# Patient Record
Sex: Male | Born: 1965 | Race: White | Hispanic: No | Marital: Single
Health system: Southern US, Community
[De-identification: ages and names within clinical notes are randomized; demographics above are authoritative.]

---

## 2017-06-16 ENCOUNTER — Emergency Department (HOSPITAL_COMMUNITY): Payer: 59

## 2017-06-16 ENCOUNTER — Encounter (HOSPITAL_COMMUNITY): Payer: Self-pay

## 2017-06-16 ENCOUNTER — Emergency Department (HOSPITAL_COMMUNITY)
Admission: EM | Admit: 2017-06-16 | Discharge: 2017-06-17 | Disposition: A | Discharge status: Home / Self Care | Payer: 59 | Attending: Emergency Medicine | Admitting: Emergency Medicine

## 2017-06-16 DIAGNOSIS — Y908 Blood alcohol level of 240 mg/100 ml or more: Secondary | ICD-10-CM | POA: Diagnosis not present

## 2017-06-16 DIAGNOSIS — F10220 Alcohol dependence with intoxication, uncomplicated: Secondary | ICD-10-CM | POA: Diagnosis not present

## 2017-06-16 DIAGNOSIS — R569 Unspecified convulsions: Secondary | ICD-10-CM | POA: Insufficient documentation

## 2017-06-16 DIAGNOSIS — F1910 Other psychoactive substance abuse, uncomplicated: Secondary | ICD-10-CM | POA: Diagnosis not present

## 2017-06-16 LAB — RAPID URINE DRUG SCREEN, HOSP PERFORMED
AMPHETAMINES: NOT DETECTED
BARBITURATES: NOT DETECTED
Benzodiazepines: NOT DETECTED
Cocaine: POSITIVE — AB
Opiates: NOT DETECTED
Tetrahydrocannabinol: NOT DETECTED

## 2017-06-16 LAB — ETHANOL: ALCOHOL ETHYL (B): 368 mg/dL — AB (ref <5)

## 2017-06-16 LAB — CBC WITH DIFFERENTIAL/PLATELET
BASOS ABS: 0 10*3/uL (ref 0.0–0.1)
Basophils Relative: 1 %
Eosinophils Absolute: 0 10*3/uL (ref 0.0–0.7)
Eosinophils Relative: 0 %
HEMATOCRIT: 51.4 % (ref 39.0–52.0)
HEMOGLOBIN: 18.9 g/dL — AB (ref 13.0–17.0)
LYMPHS PCT: 29 %
Lymphs Abs: 2.4 10*3/uL (ref 0.7–4.0)
MCH: 33.1 pg (ref 26.0–34.0)
MCHC: 36.8 g/dL — ABNORMAL HIGH (ref 30.0–36.0)
MCV: 90 fL (ref 78.0–100.0)
MONO ABS: 0.5 10*3/uL (ref 0.1–1.0)
Monocytes Relative: 6 %
NEUTROS ABS: 5.2 10*3/uL (ref 1.7–7.7)
Neutrophils Relative %: 64 %
Platelets: 282 10*3/uL (ref 150–400)
RBC: 5.71 MIL/uL (ref 4.22–5.81)
RDW: 13.7 % (ref 11.5–15.5)
WBC: 8.1 10*3/uL (ref 4.0–10.5)

## 2017-06-16 LAB — COMPREHENSIVE METABOLIC PANEL
ALT: 34 U/L (ref 17–63)
AST: 39 U/L (ref 15–41)
Albumin: 4.7 g/dL (ref 3.5–5.0)
Alkaline Phosphatase: 85 U/L (ref 38–126)
Anion gap: 18 — ABNORMAL HIGH (ref 5–15)
BILIRUBIN TOTAL: 0.7 mg/dL (ref 0.3–1.2)
BUN: 8 mg/dL (ref 6–20)
CO2: 26 mmol/L (ref 22–32)
CREATININE: 0.99 mg/dL (ref 0.61–1.24)
Calcium: 9.6 mg/dL (ref 8.9–10.3)
Chloride: 98 mmol/L — ABNORMAL LOW (ref 101–111)
GFR calc Af Amer: >60 mL/min (ref >60)
Glucose, Bld: 122 mg/dL — ABNORMAL HIGH (ref 65–99)
Potassium: 3.6 mmol/L (ref 3.5–5.1)
Sodium: 142 mmol/L (ref 135–145)
TOTAL PROTEIN: 8.5 g/dL — AB (ref 6.5–8.1)

## 2017-06-16 MED ORDER — SODIUM CHLORIDE 0.9 % IV SOLN
1500.0000 mg | Freq: Once | INTRAVENOUS | Status: AC
  Administered 2017-06-16: 1500 mg via INTRAVENOUS
  Filled 2017-06-16: qty 15

## 2017-06-16 MED ORDER — ONDANSETRON HCL 4 MG/2ML IJ SOLN
4.0000 mg | Freq: Once | INTRAMUSCULAR | Status: AC
  Administered 2017-06-16: 4 mg via INTRAVENOUS
  Filled 2017-06-16: qty 2

## 2017-06-16 MED ORDER — SODIUM CHLORIDE 0.9 % IV BOLUS (SEPSIS)
1000.0000 mL | Freq: Once | INTRAVENOUS | Status: AC
  Administered 2017-06-16: 1000 mL via INTRAVENOUS

## 2017-06-16 NOTE — ED Provider Notes (Signed)
[facility]-EMERGENCY DEPT Provider Note   CSN: [id number] Arrival date & time: 06/16/17  1551     History   Chief Complaint Chief Complaint  Patient presents with  . Seizures    HPI Derek Ramsey is a 51 y.o. male.  HPI   51 year old male with a history of alcohol withdrawal seizures presents with concern for seizure. Patient reports that he has been drinking a lot over the last 3 days. Reports that he had been sober, however started drinking again about 3 days ago and has been drinking "A LOT, I mean A LOT" he is not sure when his last drink was today, but reports may be it was 5 AM, may be 11 AM, and had gone to try to check himself into rehabilitation. While he was in the waiting room at rehabilitation he had a grand mal seizure while sitting in a chair. No history of trauma. He was given Valium, and became minimally responsive with EMS, however on arrival to the emergency department he is awake, answering questions however appears intoxicated. Patient reports he did think he was having anxiety and tremors prior to this. He has a history of alcohol withdrawal seizures, but no other history of seizure disorder. Denies any other recent illness  History reviewed. No pertinent past medical history.  There are no active problems to display for this patient.   History reviewed. No pertinent surgical history.     Home Medications    Prior to Admission medications   Not on File    Family History No family history on file.  Social History Social History  Substance Use Topics  . Smoking status: Unknown If Ever Smoked  . Smokeless tobacco: Not on file  . Alcohol use Yes     Comment: heavy binge     Allergies   Patient has no known allergies.   Review of Systems Review of Systems  Constitutional: Negative for fever.  HENT: Negative for sore throat.   Eyes: Negative for visual disturbance.  Respiratory: Negative for shortness of breath.   Cardiovascular: Negative for  chest pain.  Gastrointestinal: Negative for abdominal pain.  Genitourinary: Negative for difficulty urinating.  Musculoskeletal: Negative for back pain and neck stiffness.  Skin: Negative for rash.  Neurological: Positive for seizures. Negative for syncope and headaches.     Physical Exam Updated Vital Signs BP (!) 152/105   Pulse 93   Temp (!) 97.5 F (36.4 C) (Axillary)   Resp 10   SpO2 95%   Physical Exam  Constitutional: He is oriented to person, place, and time. He appears well-developed and well-nourished. No distress.  Appears intoxicated  HENT:  Head: Normocephalic and atraumatic.  Eyes: Conjunctivae and EOM are normal.  Neck: Normal range of motion.  Cardiovascular: Normal rate, regular rhythm, normal heart sounds and intact distal pulses.  Exam reveals no gallop and no friction rub.   No murmur heard. Pulmonary/Chest: Effort normal and breath sounds normal. No respiratory distress. He has no wheezes. He has no rales.  Abdominal: Soft. He exhibits no distension. There is no tenderness. There is no guarding.  Musculoskeletal: He exhibits no edema.  Neurological: He is alert and oriented to person, place, and time. He displays no tremor.  No tremors  Skin: Skin is warm and dry. He is not diaphoretic.  Nursing note and vitals reviewed.    ED Treatments / Results  Labs (all labs ordered are listed, but only abnormal results are displayed) Labs Reviewed  CBC WITH DIFFERENTIAL/PLATELET -  Abnormal; Notable for the following:       Result Value   Hemoglobin 18.9 (*)    MCHC 36.8 (*)    All other components within normal limits  COMPREHENSIVE METABOLIC PANEL - Abnormal; Notable for the following:    Chloride 98 (*)    Glucose, Bld 122 (*)    Total Protein 8.5 (*)    Anion gap 18 (*)    All other components within normal limits  ETHANOL - Abnormal; Notable for the following:    Alcohol, Ethyl (B) 368 (*)    All other components within normal limits  RAPID URINE  DRUG SCREEN, HOSP PERFORMED - Abnormal; Notable for the following:    Cocaine POSITIVE (*)    All other components within normal limits    EKG  EKG Interpretation  Date/Time:  Tuesday June 16 2017 16:07:51 EDT Ventricular Rate:  106 PR Interval:    QRS Duration: 89 QT Interval:  333 QTC Calculation: 443 R Axis:   55 Text Interpretation:  Sinus tachycardia No prior ECG Confirmed by Alvira Monday ([id number]) on 06/16/2017 6:26:43 PM       Radiology Ct Head Wo Contrast  Result Date: 06/16/2017 CLINICAL DATA:  Recent seizure activity EXAM: CT HEAD WITHOUT CONTRAST TECHNIQUE: Contiguous axial images were obtained from the base of the skull through the vertex without intravenous contrast. COMPARISON:  None. FINDINGS: Brain: Mild atrophic changes are noted. No findings to suggest acute hemorrhage, acute infarction or space-occupying mass lesion are noted. Vascular: No hyperdense vessel or unexpected calcification. Skull: Normal. Negative for fracture or focal lesion. Sinuses/Orbits: Opacification of the left maxillary antrum is noted of uncertain chronicity. It has a chronic appearance however. 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 No acute intracranial abnormality is noted. Electronically Signed   By: Alcide Clever M.D.   On: 06/16/2017 17:04    Procedures Procedures (including critical care time)  Medications Ordered in ED Medications  sodium chloride 0.9 % bolus 1,000 mL (0 mLs Intravenous Stopped 06/16/17 1805)  ondansetron (ZOFRAN) injection 4 mg (4 mg Intravenous Given 06/16/17 1627)  levETIRAcetam (KEPPRA) 1,500 mg in sodium chloride 0.9 % 100 mL IVPB (0 mg Intravenous Stopped 06/16/17 1805)     Initial Impression / Assessment and Plan / ED Course  I have reviewed the triage vital signs and the nursing notes.  Pertinent labs & imaging results that were available during my care of the patient were reviewed by me and  considered in my medical decision making (see chart for details).    51 year old male with history of alcohol withdrawal seizures presents with concern for seizure. Patient had checked into a rehabilitation facility and had a grand mal seizure all waiting to be seen, with reporting last drink several hours prior to his presentation there although history is limited.  The patient is mildly hypertensive and mildly tachycardic on exam, he has no tremors, and appears quite intoxicated.  His alcohol level is 368. Given significant elevation and alcohol level, no tremors, patient appearing intoxicated on exam more than appearing woman in withdrawal, I doubt this represents a withdrawal seizure. Patient's UDS is positive for cocaine, and feel his seizures likely secondary to cocaine use. This may also contribute to his mild tachycardia and hypertension.  Head CT shows no acute abnormalities. Other labs showed no significant electrolyte abnormalities.  Patient was given Keppra the emergency department.  However, with UDS positive for cocaine, feel this most likely  etiology seizure, and patient does not have any history of prior seizures that were not related to alcohol withdrawal or substance abuse, so do not feel continued antiepileptics are indicated. We will refer him to neurology, however for further testing, consideration outpatient EEG.  Discussed results with patient.  Patient observed until clinically sober in the ED, able to ambulate, drinking fluids.  [facility].    Final Clinical Impressions(s) / ED Diagnoses   Final diagnoses:  Seizure (HCC)  Substance abuse  Alcohol dependence with uncomplicated intoxication (HCC)    New Prescriptions New Prescriptions   No medications on file     Alvira MondaySchlossman, Ura Hausen, MD 06/17/17 [phone]

## 2017-06-16 NOTE — ED Notes (Signed)
Eric from [facility] called for update on patient. Stated when pt is alert and able to ambulate and discharged to call facilty and someone should be able to pick him up.

## 2017-06-16 NOTE — ED Notes (Signed)
Patient transported to CT 

## 2017-06-16 NOTE — ED Triage Notes (Signed)
Per EMS, pt 4 years sober relapsed on alcohol, drank vodka, blew .29 BAC was at a rehab facility, had witnessed grand mal seizure x 2 minutes. Given 10 mg Valium IM at 1500, heavily sedated, become unconscious and difficult to arouse with deep painful stimuli.

## 2017-06-16 NOTE — ED Notes (Signed)
Patient continues resting with eyes closed, snoring at times, VS wnl.

## 2017-06-16 NOTE — ED Notes (Signed)
Bed: RESA Expected date:  Expected time:  Means of arrival:  Comments: 51 yo m seizure, unresponsive

## 2017-06-16 NOTE — ED Notes (Signed)
Escorted patient to restroom via walking Patient very unsteady on feet and stumbles back and forth.

## 2017-06-17 NOTE — ED Notes (Signed)
Patient has drank 10 cups of juice with no emesis. [facility]waiting [facility] to come and transport patient back to facility

## 2017-06-17 NOTE — ED Notes (Signed)
Report to Marylu LundJanet at [facility] and labs faxed over for viewing

## 2017-06-17 NOTE — ED Notes (Signed)
Patient ambulatory to nurse's desk to ask for more juice-Dr. Dalene SeltzerSchlossman made aware

## 2018-09-30 ENCOUNTER — Emergency Department (HOSPITAL_COMMUNITY): Payer: BLUE CROSS/BLUE SHIELD

## 2018-09-30 ENCOUNTER — Emergency Department (HOSPITAL_COMMUNITY)
Admission: EM | Admit: 2018-09-30 | Discharge: 2018-09-30 | Disposition: A | Discharge status: Home / Self Care | Payer: BLUE CROSS/BLUE SHIELD | Attending: Emergency Medicine | Admitting: Emergency Medicine

## 2018-09-30 ENCOUNTER — Encounter (HOSPITAL_COMMUNITY): Payer: Self-pay | Admitting: Emergency Medicine

## 2018-09-30 DIAGNOSIS — F10921 Alcohol use, unspecified with intoxication delirium: Secondary | ICD-10-CM

## 2018-09-30 DIAGNOSIS — F1410 Cocaine abuse, uncomplicated: Secondary | ICD-10-CM | POA: Diagnosis not present

## 2018-09-30 DIAGNOSIS — G40909 Epilepsy, unspecified, not intractable, without status epilepticus: Secondary | ICD-10-CM | POA: Insufficient documentation

## 2018-09-30 DIAGNOSIS — F10121 Alcohol abuse with intoxication delirium: Secondary | ICD-10-CM | POA: Insufficient documentation

## 2018-09-30 DIAGNOSIS — F10920 Alcohol use, unspecified with intoxication, uncomplicated: Secondary | ICD-10-CM

## 2018-09-30 DIAGNOSIS — R569 Unspecified convulsions: Secondary | ICD-10-CM

## 2018-09-30 LAB — CBC WITH DIFFERENTIAL/PLATELET
ABS IMMATURE GRANULOCYTES: 0.01 10*3/uL (ref 0.00–0.07)
BASOS PCT: 1 %
Basophils Absolute: 0.1 10*3/uL (ref 0.0–0.1)
Eosinophils Absolute: 0.1 10*3/uL (ref 0.0–0.5)
Eosinophils Relative: 1 %
HCT: 53.8 % — ABNORMAL HIGH (ref 39.0–52.0)
HEMOGLOBIN: 17.2 g/dL — AB (ref 13.0–17.0)
Immature Granulocytes: 0 %
LYMPHS PCT: 24 %
Lymphs Abs: 1.6 10*3/uL (ref 0.7–4.0)
MCH: 30.4 pg (ref 26.0–34.0)
MCHC: 32 g/dL (ref 30.0–36.0)
MCV: 95.1 fL (ref 80.0–100.0)
Monocytes Absolute: 0.5 10*3/uL (ref 0.1–1.0)
Monocytes Relative: 7 %
NEUTROS ABS: 4.2 10*3/uL (ref 1.7–7.7)
NEUTROS PCT: 67 %
PLATELETS: 262 10*3/uL (ref 150–400)
RBC: 5.66 MIL/uL (ref 4.22–5.81)
RDW: 13.6 % (ref 11.5–15.5)
WBC: 6.4 10*3/uL (ref 4.0–10.5)
nRBC: 0 % (ref 0.0–0.2)

## 2018-09-30 LAB — COMPREHENSIVE METABOLIC PANEL
ALBUMIN: 4 g/dL (ref 3.5–5.0)
ALK PHOS: 70 U/L (ref 38–126)
ALT: 13 U/L (ref 0–44)
ANION GAP: 18 — AB (ref 5–15)
AST: 27 U/L (ref 15–41)
BUN: 5 mg/dL — ABNORMAL LOW (ref 6–20)
CALCIUM: 8.7 mg/dL — AB (ref 8.9–10.3)
CHLORIDE: 102 mmol/L (ref 98–111)
CO2: 22 mmol/L (ref 22–32)
CREATININE: 0.85 mg/dL (ref 0.61–1.24)
GFR calc non Af Amer: >60 mL/min (ref >60)
GLUCOSE: 94 mg/dL (ref 70–99)
Potassium: 4 mmol/L (ref 3.5–5.1)
SODIUM: 142 mmol/L (ref 135–145)
Total Bilirubin: 0.7 mg/dL (ref 0.3–1.2)
Total Protein: 7.4 g/dL (ref 6.5–8.1)

## 2018-09-30 LAB — ETHANOL: ALCOHOL ETHYL (B): 363 mg/dL — AB (ref <10)

## 2018-09-30 LAB — RAPID URINE DRUG SCREEN, HOSP PERFORMED
Amphetamines: NOT DETECTED
BARBITURATES: NOT DETECTED
Benzodiazepines: NOT DETECTED
Cocaine: POSITIVE — AB
Opiates: NOT DETECTED
Tetrahydrocannabinol: POSITIVE — AB

## 2018-09-30 MED ORDER — LORAZEPAM 1 MG PO TABS: 1.0000 mg | ORAL_TABLET | Freq: Four times a day (QID) | ORAL | Status: DC | PRN

## 2018-09-30 MED ORDER — SODIUM CHLORIDE 0.9 % IV BOLUS
1000.0000 mL | Freq: Once | INTRAVENOUS | Status: AC
  Administered 2018-09-30: 1000 mL via INTRAVENOUS

## 2018-09-30 MED ORDER — ONDANSETRON HCL 4 MG/2ML IJ SOLN
4.0000 mg | Freq: Once | INTRAMUSCULAR | Status: AC
  Administered 2018-09-30: 4 mg via INTRAVENOUS
  Filled 2018-09-30: qty 2

## 2018-09-30 MED ORDER — LORAZEPAM 2 MG/ML IJ SOLN
2.0000 mg | Freq: Once | INTRAMUSCULAR | Status: DC
  Filled 2018-09-30: qty 1

## 2018-09-30 MED ORDER — THIAMINE HCL 100 MG/ML IJ SOLN: 100.0000 mg | Freq: Every day | INTRAMUSCULAR | Status: DC

## 2018-09-30 MED ORDER — VITAMIN B-1 100 MG PO TABS
100.0000 mg | ORAL_TABLET | Freq: Every day | ORAL | Status: DC
  Administered 2018-09-30: 100 mg via ORAL
  Filled 2018-09-30: qty 1

## 2018-09-30 MED ORDER — ADULT MULTIVITAMIN W/MINERALS CH
1.0000 | ORAL_TABLET | Freq: Every day | ORAL | Status: DC
  Administered 2018-09-30: 1 via ORAL
  Filled 2018-09-30: qty 1

## 2018-09-30 MED ORDER — LORAZEPAM 2 MG/ML IJ SOLN
1.0000 mg | Freq: Four times a day (QID) | INTRAMUSCULAR | Status: DC | PRN
  Administered 2018-09-30: 1 mg via INTRAVENOUS
  Filled 2018-09-30: qty 1

## 2018-09-30 MED ORDER — FOLIC ACID 1 MG PO TABS
1.0000 mg | ORAL_TABLET | Freq: Every day | ORAL | Status: DC
  Administered 2018-09-30: 1 mg via ORAL
  Filled 2018-09-30: qty 1

## 2018-09-30 NOTE — ED Triage Notes (Signed)
Pt here from [facility] with c/o a seizure today , history of etoh Seizure , pt did tell EMS that he did cocaine today also

## 2018-09-30 NOTE — Care Management (Signed)
ED CM consulted by EDP concerning assistance needed for detox once medically cleared from the ED. CM met with patient and family with concerns regarding return to [facility]. Patient and family would like to know if patient can return to complete assessment. CM contacted [facility] spoke with Sharyn Blitz RN who states, she discussed with patient and family they are unable to meet the level of care but provided information  [facility] who may be better equip to meet his needs. Updated the EDP, CM also spoke with family who contact the facility and will drive patient there tonight.

## 2018-09-30 NOTE — ED Notes (Signed)
Significant other called [facility] and they state that pt may come there once medically cleared.  Per significant other hospital staff will have to call them to confirm

## 2018-09-30 NOTE — ED Notes (Signed)
Pt has been intermittently tearful

## 2018-09-30 NOTE — ED Notes (Signed)
Pty significant other calls me to room as pt is having what I initially thought to be a seizure. Pt was tachy and stiff, pt did seem to be alert during this and was able to hold on to bedrail (seizure pad is in place since arrival) pt was able to respond to me.  EDP came to bedside and pt was alert again, episode had subsided

## 2018-09-30 NOTE — ED Provider Notes (Signed)
[facility] EMERGENCY DEPARTMENT Provider Note   CSN: [id number] Arrival date & time: 09/30/18  1410     History   Chief Complaint Chief Complaint  Patient presents with  . Seizures    HPI Derek Ramsey is a 52 y.o. male.  The history is provided by the EMS personnel, medical records and a significant other. No language interpreter was used.  Seizures       52 year old male with history of alcohol abuse presenting via EMS from [facility] for potential seizure activity.  History obtained through EMS personnel who is at bedside.  Patient show up to [facility] today for alcohol detox and rehab.  After dropping off at [facility], patient had seizure-like activities, described as loss of consciousness, and generalized tremors.  He also vomited once.  History was limited as patient appears to be postictal.  Patient does have known history of alcohol abuse, his girlfriend who is at bedside report he has been drinking heavily within the past 3 weeks including multiple bottles of wine daily last drink was today.  He also used cocaine intermittently.  EMS personnel mention that patient did admits to using cocaine during initial assessment.  His girlfriend did not report of any SI or HI.  Level 5 caveats due to altered mental status.  No past medical history on file.  There are no active problems to display for this patient.   No past surgical history on file.      Home Medications    Prior to Admission medications   Not on File    Family History No family history on file.  Social History Social History   Tobacco Use  . Smoking status: Unknown If Ever Smoked  Substance Use Topics  . Alcohol use: Yes    Comment: heavy binge  . Drug use: Yes    Comment: benzodiazepines     Allergies   Patient has no known allergies.   Review of Systems Review of Systems  Neurological: Positive for seizures.  All other systems reviewed and are  negative.    Physical Exam Updated Vital Signs There were no vitals taken for this visit.  Physical Exam  Constitutional: He appears well-developed and well-nourished.  Somnolence, arousable to sternal rub.  HENT:  Head: Normocephalic and atraumatic.  No tongue injury  Eyes: Pupils are equal, round, and reactive to light. Conjunctivae are normal.  Pupils are 5 mm bilaterally and reactive  Neck: Neck supple.  No significant midline spine tenderness crepitus step-off  Cardiovascular:  Tachycardia without murmur rubs or gallop  Pulmonary/Chest: Effort normal and breath sounds normal. He has no wheezes. He has no rales.  Abdominal: Soft. He exhibits no distension. There is no tenderness.  Neurological: He is alert. GCS eye subscore is 2. GCS verbal subscore is 3. GCS motor subscore is 5.  Skin: No rash noted.  Psychiatric: He has a normal mood and affect.  Nursing note and vitals reviewed.    ED Treatments / Results  Labs (all labs ordered are listed, but only abnormal results are displayed) Labs Reviewed  CBC WITH DIFFERENTIAL/PLATELET - Abnormal; Notable for the following components:      Result Value   Hemoglobin 17.2 (*)    HCT 53.8 (*)    All other components within normal limits  ETHANOL  RAPID URINE DRUG SCREEN, HOSP PERFORMED  COMPREHENSIVE METABOLIC PANEL    EKG None  Radiology No results found.  Procedures Procedures (including critical care time)  Medications Ordered in ED Medications  sodium chloride 0.9 % bolus 1,000 mL (1,000 mLs Intravenous New Bag/Given 09/30/18 1509)  LORazepam (ATIVAN) tablet 1 mg (has no administration in time range)    Or  LORazepam (ATIVAN) injection 1 mg (has no administration in time range)  thiamine (VITAMIN B-1) tablet 100 mg (has no administration in time range)    Or  thiamine (B-1) injection 100 mg (has no administration in time range)  folic acid (FOLVITE) tablet 1 mg (has no administration in time range)    multivitamin with minerals tablet 1 tablet (has no administration in time range)  ondansetron (ZOFRAN) injection 4 mg (4 mg Intravenous Given 09/30/18 1513)     Initial Impression / Assessment and Plan / ED Course  I have reviewed the triage vital signs and the nursing notes.  Pertinent labs & imaging results that were available during my care of the patient were reviewed by me and considered in my medical decision making (see chart for details).     BP (!) 174/106   Pulse (!) 124   Temp 98.5 F (36.9 C) (Oral)   Resp (!) 28   SpO2 98%    Final Clinical Impressions(s) / ED Diagnoses   Final diagnoses:  Seizure-like activity (HCC)  Alcohol intoxication with delirium (HCC)  Cocaine abuse [facility])    ED Discharge Orders    None     2:30 PM Patient brought here for potential seizure activities while he presented to [facility] for rehab of alcohol abuse.  Also with known history of cocaine use.  He is currently postictal, arousable with painful stimuli.  Patient was found to be hypertensive with initial blood pressure of 195/1 17, and tachycardic with heart rate of 110.  Will placed pt in CIWA protocol and monitor closely.    3:27 PM Pt sign out to [employer], PA-C who will continue to monitor pt.  Suspect elevated alcohol level, and pt is intoxicated.  Sxs less likely seizure.  Elevated BP, suspect 2/2 cocaine use.  Pt will need to be monitor closely.     [name], PA-C 09/30/18 1530    Mesner, [name], MD 10/01/18 [phone]

## 2019-09-12 IMAGING — CT CT HEAD W/O CM
4 series · 17 of 47 positions shown, 19 images · non-contrast
Comparison: 06/16/2017.

CLINICAL DATA: Encephalopathy. Alcohol abuse. Possible alcohol
withdrawal seizure.

EXAM:
CT HEAD WITHOUT CONTRAST
TECHNIQUE: Contiguous axial images were obtained from the base of the skull
through the vertex without intravenous contrast.

[Series 2: head wo · axial · 0.43mm/px · z∈[-106,+20]mm · 7 of 35 slices shown, 9 images]
[im 5/35  brain]
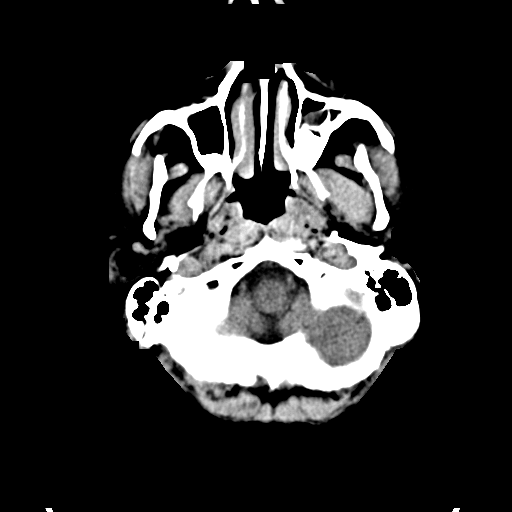
[im 5/35  bone]
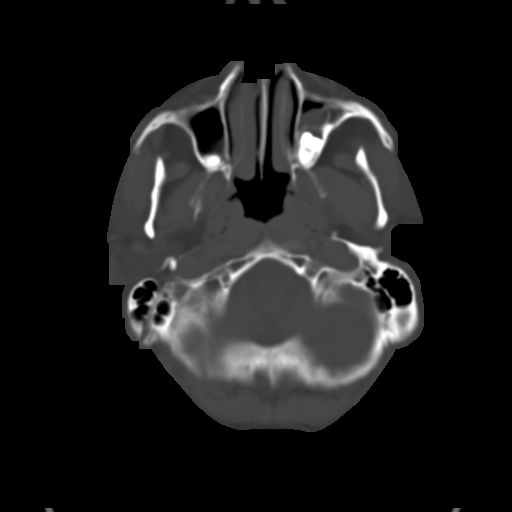
[im 9/35  brain]
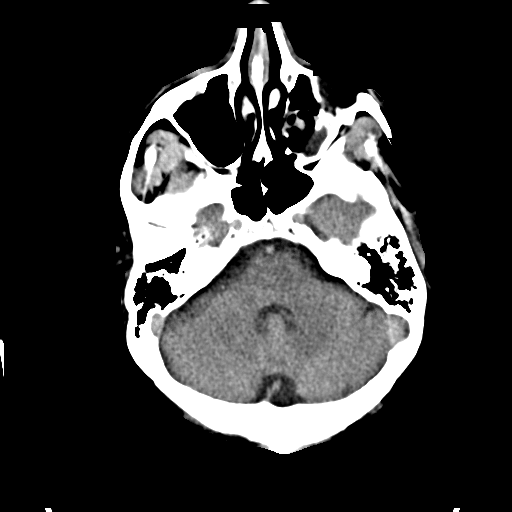
[im 13/35  brain]
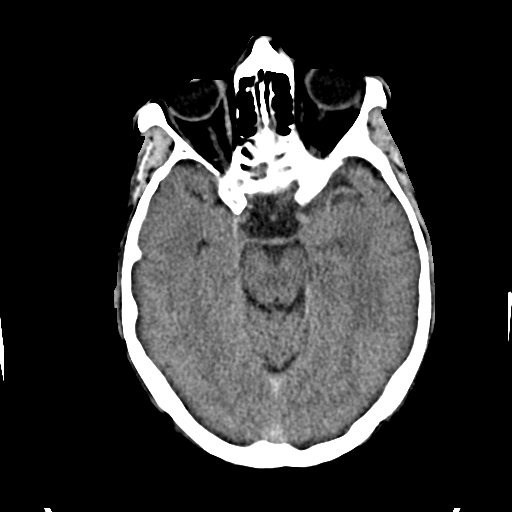
[im 18/35  brain]
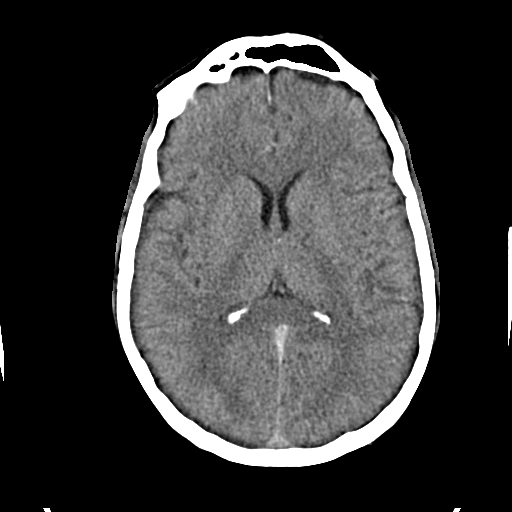
[im 22/35  brain]
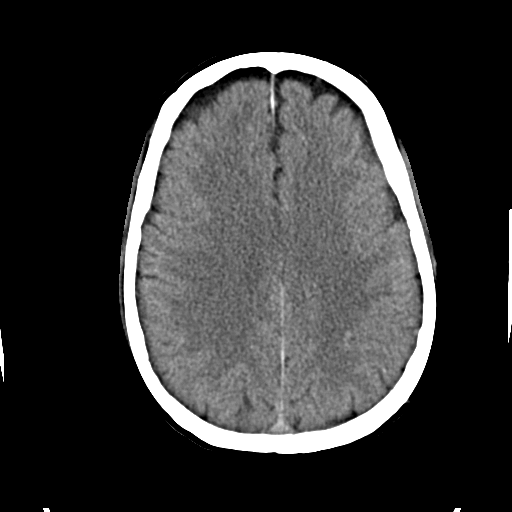
[im 22/35  bone]
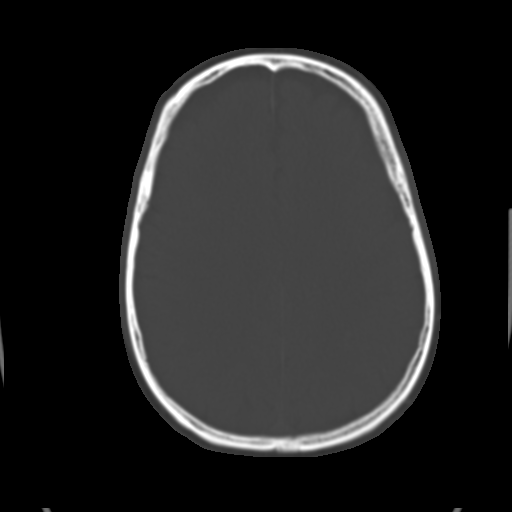
[im 26/35  brain]
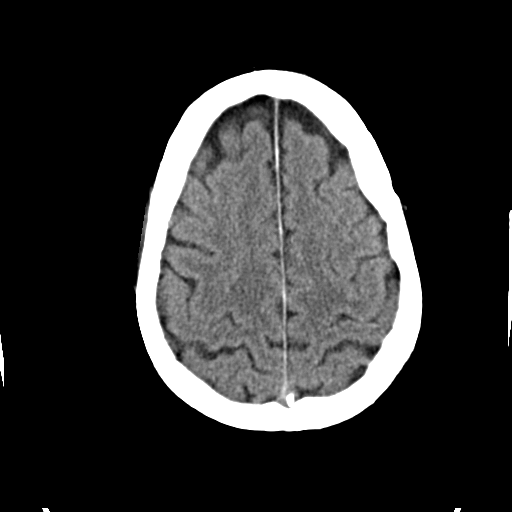
[im 30/35  brain]
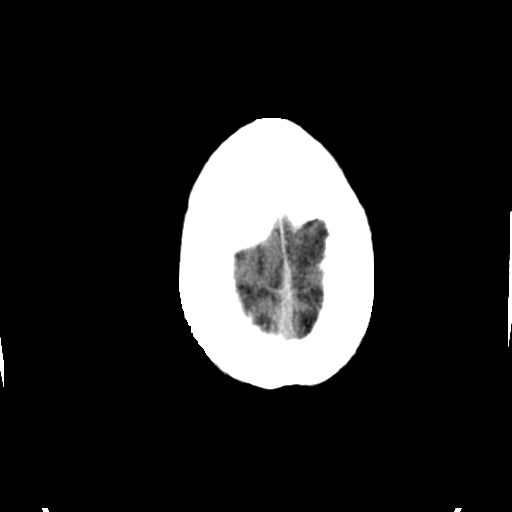

[Series 3: head bone · axial · 0.43mm/px · z∈[-110,-50]mm · 4 of 86 slices shown]
[im 9/86  bone]
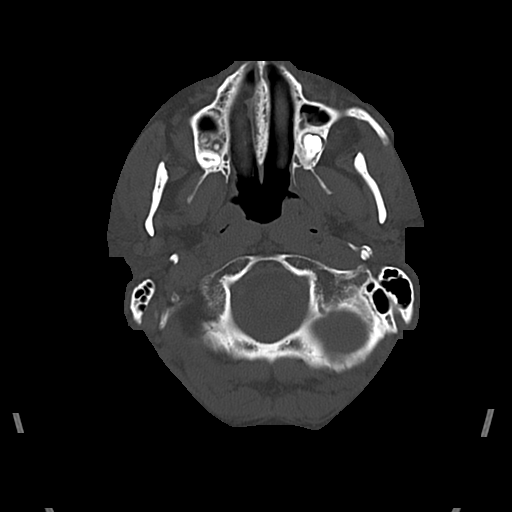
[im 18/86  bone]
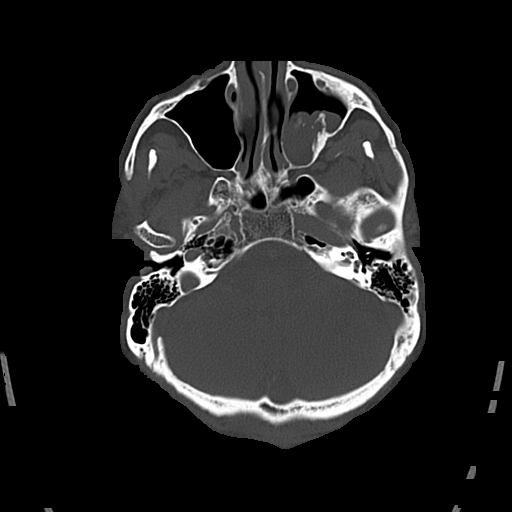
[im 26/86  bone]
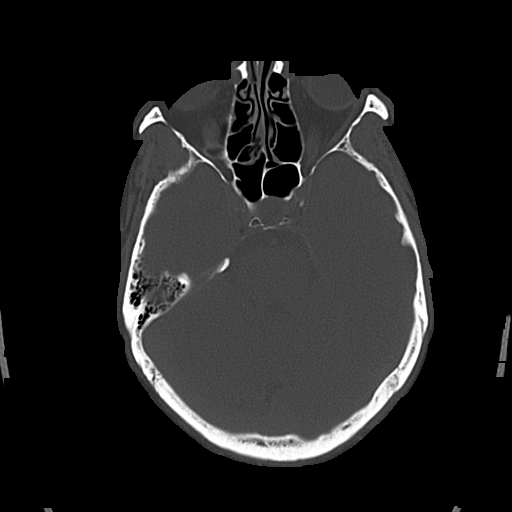
[im 39/86  bone]
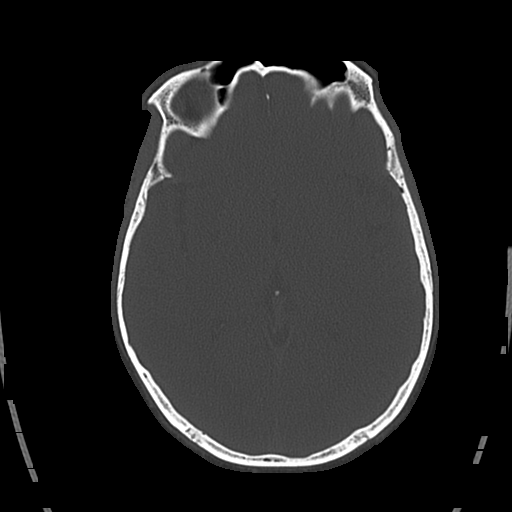

[Series 4: cor soft · coronal · 0.33mm/px · 3 of 72 slices shown]
[im 24/72  brain]
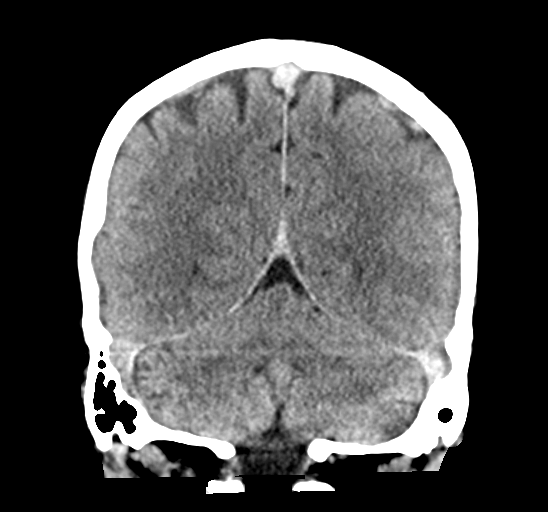
[im 32/72  brain]
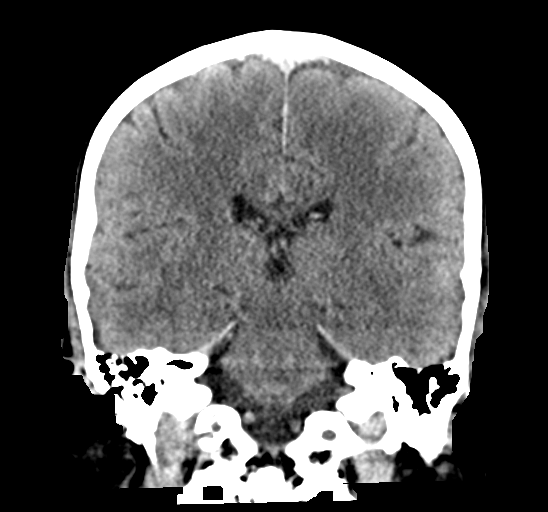
[im 40/72  brain]
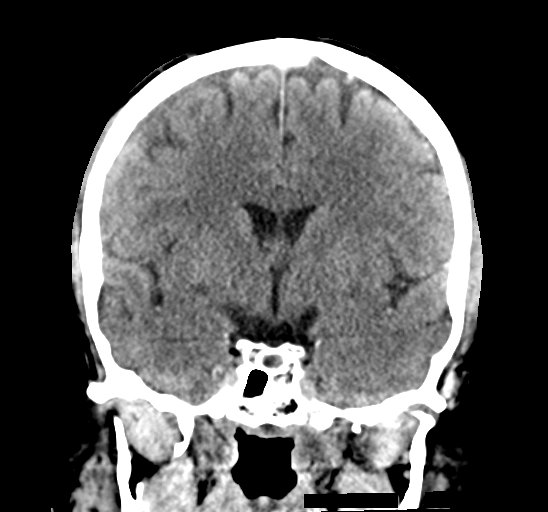

[Series 5: sag soft · sagittal · 0.33mm/px · 3 of 58 slices shown]
[im 20/58  brain]
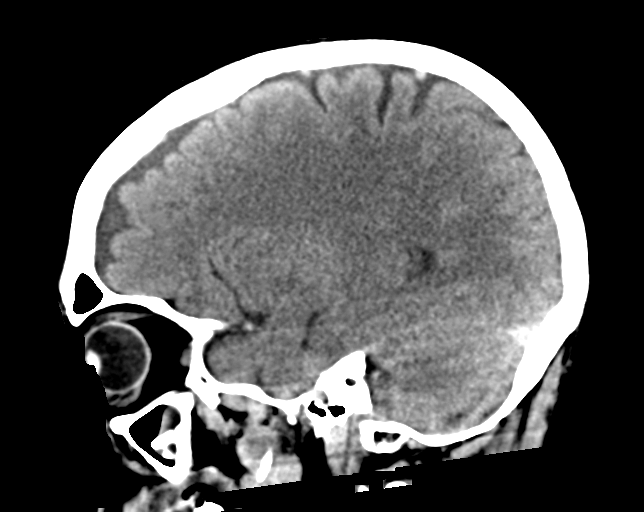
[im 29/58  brain]
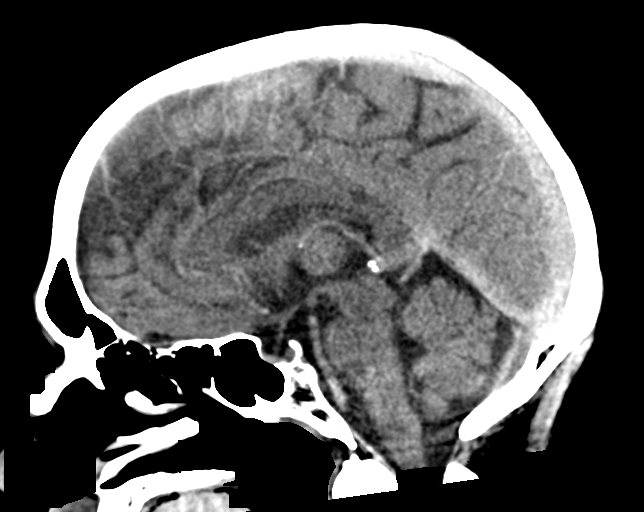
[im 39/58  brain]
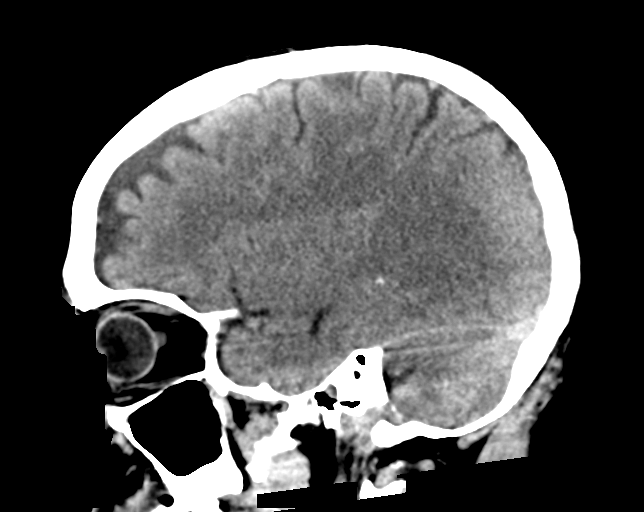

[17 of 47 positions shown; findings below may reference images not displayed]

FINDINGS: Brain: No evidence of acute infarction, hemorrhage, hydrocephalus,
extra-axial collection or mass lesion/mass effect. Normal for age
cerebral volume. No significant white matter disease.

Vascular: Calcification of the cavernous internal carotid arteries
consistent with cerebrovascular atherosclerotic disease. No signs of
intracranial large vessel occlusion.

Skull: Normal. Negative for fracture or focal lesion.

Sinuses/Orbits: No acute finding. Chronic LEFT maxillary sinus
opacification and calcification, improved from priors.

Other: None.
IMPRESSION: No acute intracranial findings. Stable normal for age cerebral
volume. Similar appearance to prior CT head 9072.
